# Patient Record
Sex: Female | Born: 1955 | Race: White | Hispanic: No | Smoking: Former smoker
Health system: Southern US, Community
[De-identification: ages and names within clinical notes are randomized; demographics above are authoritative.]

## PROBLEM LIST (undated history)

## (undated) DIAGNOSIS — T8859XA Other complications of anesthesia, initial encounter: Secondary | ICD-10-CM

## (undated) DIAGNOSIS — M1990 Unspecified osteoarthritis, unspecified site: Secondary | ICD-10-CM

## (undated) DIAGNOSIS — T4145XA Adverse effect of unspecified anesthetic, initial encounter: Secondary | ICD-10-CM

## (undated) DIAGNOSIS — K219 Gastro-esophageal reflux disease without esophagitis: Secondary | ICD-10-CM

## (undated) HISTORY — PX: DIAGNOSTIC LAPAROSCOPY: SUR761

## (undated) HISTORY — PX: VEIN LIGATION AND STRIPPING: SHX2653

## (undated) HISTORY — PX: LAPAROTOMY: SHX154

---

## 1979-01-04 HISTORY — PX: OVARIAN CYST SURGERY: SHX726

## 1996-05-05 HISTORY — PX: CHOLECYSTECTOMY: SHX55

## 2011-10-16 ENCOUNTER — Other Ambulatory Visit: Payer: Self-pay | Admitting: Neurosurgery

## 2011-10-20 ENCOUNTER — Inpatient Hospital Stay (HOSPITAL_COMMUNITY): Admission: RE | Admit: 2011-10-20 | Payer: BC Managed Care – PPO | Source: Ambulatory Visit

## 2011-10-21 ENCOUNTER — Other Ambulatory Visit: Payer: Self-pay | Admitting: Neurosurgery

## 2011-10-21 ENCOUNTER — Ambulatory Visit (HOSPITAL_COMMUNITY): Admission: RE | Admit: 2011-10-21 | Payer: BC Managed Care – PPO | Source: Ambulatory Visit | Admitting: Neurosurgery

## 2011-10-27 ENCOUNTER — Encounter (HOSPITAL_COMMUNITY): Payer: Self-pay | Admitting: Pharmacy Technician

## 2011-10-27 ENCOUNTER — Encounter (HOSPITAL_COMMUNITY)
Admission: RE | Admit: 2011-10-27 | Discharge: 2011-10-27 | Disposition: A | Discharge status: Home / Self Care | Payer: BC Managed Care – PPO | Source: Ambulatory Visit | Attending: Neurosurgery | Admitting: Neurosurgery

## 2011-10-27 ENCOUNTER — Encounter (HOSPITAL_COMMUNITY): Payer: Self-pay

## 2011-10-27 HISTORY — DX: Adverse effect of unspecified anesthetic, initial encounter: T41.45XA

## 2011-10-27 HISTORY — DX: Gastro-esophageal reflux disease without esophagitis: K21.9

## 2011-10-27 HISTORY — PX: POSTERIOR FUSION LUMBAR SPINE: SUR632

## 2011-10-27 HISTORY — DX: Unspecified osteoarthritis, unspecified site: M19.90

## 2011-10-27 HISTORY — DX: Other complications of anesthesia, initial encounter: T88.59XA

## 2011-10-27 LAB — BASIC METABOLIC PANEL
GFR calc Af Amer: >90 mL/min (ref >90)
GFR calc non Af Amer: >90 mL/min (ref >90)
Glucose, Bld: 92 mg/dL (ref 70–99)
Potassium: 4.2 mEq/L (ref 3.5–5.1)
Sodium: 139 mEq/L (ref 135–145)

## 2011-10-27 LAB — CBC
Hemoglobin: 13.6 g/dL (ref 12.0–15.0)
RBC: 4.13 MIL/uL (ref 3.87–5.11)

## 2011-10-27 LAB — TYPE AND SCREEN
ABO/RH(D): O POS
Antibody Screen: NEGATIVE

## 2011-10-27 LAB — SURGICAL PCR SCREEN
MRSA, PCR: NEGATIVE
Staphylococcus aureus: NEGATIVE

## 2011-10-27 NOTE — Pre-Procedure Instructions (Signed)
20 Halima V Peretz  10/27/2011   Your procedure is scheduled on:  6.25 .13  Report to [facility] at 530* AM.  Call this number if you have problems the morning of surgery: [phone]   Remember:   Do not eat food or drink:After Midnight.  .  Take these medicines the morning of surgery with A SIP OF WATER: none   Do not wear jewelry, make-up or nail polish.  Do not wear lotions, powders, or perfumes. You may wear deodorant.  Do not shave 48 hours prior to surgery. Men may shave face and neck.  Do not bring valuables to the hospital.  Contacts, dentures or bridgework may not be worn into surgery.  Leave suitcase in the car. After surgery it may be brought to your room.  For patients admitted to the hospital, checkout time is 11:00 AM the day of discharge.   Patients discharged the day of surgery will not be allowed to drive home.  Name and phone number of your driver: chris  spicer [phone]  Special Instructions: CHG Shower Use Special Wash: 1/2 bottle night before surgery and 1/2 bottle morning of surgery.   Please read over the following fact sheets that you were given: Pain Booklet, Coughing and Deep Breathing, Blood Transfusion Information, MRSA Information and Surgical Site Infection Prevention

## 2011-10-28 ENCOUNTER — Encounter (HOSPITAL_COMMUNITY): Payer: Self-pay | Admitting: Anesthesiology

## 2011-10-28 ENCOUNTER — Encounter (HOSPITAL_COMMUNITY): Admission: RE | Payer: Self-pay | Source: Ambulatory Visit

## 2011-10-28 ENCOUNTER — Encounter (HOSPITAL_COMMUNITY)
Admission: RE | Disposition: A | Discharge status: Home / Self Care | Payer: Self-pay | Source: Ambulatory Visit | Attending: Neurosurgery

## 2011-10-28 ENCOUNTER — Ambulatory Visit (HOSPITAL_COMMUNITY): Payer: BC Managed Care – PPO | Admitting: Anesthesiology

## 2011-10-28 ENCOUNTER — Inpatient Hospital Stay (HOSPITAL_COMMUNITY)
Admission: RE | Admit: 2011-10-28 | Discharge: 2011-10-30 | DRG: 756 | Disposition: A | Discharge status: Home / Self Care | Payer: BC Managed Care – PPO | Source: Ambulatory Visit | Attending: Neurosurgery | Admitting: Neurosurgery

## 2011-10-28 ENCOUNTER — Encounter (HOSPITAL_COMMUNITY): Payer: Self-pay

## 2011-10-28 ENCOUNTER — Ambulatory Visit (HOSPITAL_COMMUNITY): Payer: BC Managed Care – PPO

## 2011-10-28 DIAGNOSIS — Z01812 Encounter for preprocedural laboratory examination: Secondary | ICD-10-CM

## 2011-10-28 DIAGNOSIS — Z87891 Personal history of nicotine dependence: Secondary | ICD-10-CM

## 2011-10-28 DIAGNOSIS — M4310 Spondylolisthesis, site unspecified: Secondary | ICD-10-CM | POA: Diagnosis present

## 2011-10-28 DIAGNOSIS — Z888 Allergy status to other drugs, medicaments and biological substances status: Secondary | ICD-10-CM

## 2011-10-28 DIAGNOSIS — Z9089 Acquired absence of other organs: Secondary | ICD-10-CM

## 2011-10-28 DIAGNOSIS — M129 Arthropathy, unspecified: Secondary | ICD-10-CM | POA: Diagnosis present

## 2011-10-28 DIAGNOSIS — K219 Gastro-esophageal reflux disease without esophagitis: Secondary | ICD-10-CM | POA: Diagnosis present

## 2011-10-28 DIAGNOSIS — M48062 Spinal stenosis, lumbar region with neurogenic claudication: Principal | ICD-10-CM | POA: Diagnosis present

## 2011-10-28 DIAGNOSIS — Z881 Allergy status to other antibiotic agents status: Secondary | ICD-10-CM

## 2011-10-28 SURGERY — POSTERIOR LUMBAR FUSION 1 LEVEL
Anesthesia: General | Site: Back | Wound class: Clean

## 2011-10-28 SURGERY — POSTERIOR LUMBAR FUSION 1 LEVEL
Anesthesia: General | Site: Back

## 2011-10-28 MED ORDER — GABAPENTIN 100 MG PO CAPS
200.0000 mg | ORAL_CAPSULE | Freq: Three times a day (TID) | ORAL | Status: DC
  Administered 2011-10-28 – 2011-10-30 (×6): 200 mg via ORAL
  Filled 2011-10-28 (×8): qty 2

## 2011-10-28 MED ORDER — SODIUM CHLORIDE 0.9 % IV SOLN
INTRAVENOUS | Status: DC
  Administered 2011-10-29 (×2): via INTRAVENOUS

## 2011-10-28 MED ORDER — SODIUM CHLORIDE 0.9 % IJ SOLN
3.0000 mL | Freq: Two times a day (BID) | INTRAMUSCULAR | Status: DC
  Administered 2011-10-28 – 2011-10-30 (×5): 3 mL via INTRAVENOUS

## 2011-10-28 MED ORDER — MIDAZOLAM HCL 5 MG/5ML IJ SOLN
INTRAMUSCULAR | Status: DC | PRN
  Administered 2011-10-28: 2 mg via INTRAVENOUS

## 2011-10-28 MED ORDER — HEMOSTATIC AGENTS (NO CHARGE) OPTIME
TOPICAL | Status: DC | PRN
  Administered 2011-10-28: 1 via TOPICAL

## 2011-10-28 MED ORDER — ROCURONIUM BROMIDE 100 MG/10ML IV SOLN
INTRAVENOUS | Status: DC | PRN
  Administered 2011-10-28: 50 mg via INTRAVENOUS

## 2011-10-28 MED ORDER — BUPIVACAINE-EPINEPHRINE PF 0.5-1:200000 % IJ SOLN
INTRAMUSCULAR | Status: DC | PRN
  Administered 2011-10-28: 20 mL

## 2011-10-28 MED ORDER — LACTATED RINGERS IV SOLN
INTRAVENOUS | Status: DC | PRN
  Administered 2011-10-28 (×2): via INTRAVENOUS

## 2011-10-28 MED ORDER — CEFAZOLIN SODIUM 1-5 GM-% IV SOLN
1.0000 g | INTRAVENOUS | Status: AC
Start: 2011-10-28 — End: 2011-10-28
  Administered 2011-10-28: 1 g via INTRAVENOUS

## 2011-10-28 MED ORDER — PROPOFOL 10 MG/ML IV EMUL
INTRAVENOUS | Status: DC | PRN
  Administered 2011-10-28: 180 mg via INTRAVENOUS

## 2011-10-28 MED ORDER — ACETAMINOPHEN 650 MG RE SUPP: 650.0000 mg | RECTAL | Status: DC | PRN

## 2011-10-28 MED ORDER — 0.9 % SODIUM CHLORIDE (POUR BTL) OPTIME
TOPICAL | Status: DC | PRN
  Administered 2011-10-28: 1000 mL

## 2011-10-28 MED ORDER — MORPHINE SULFATE 4 MG/ML IJ SOLN: 4.0000 mg | INTRAMUSCULAR | Status: DC | PRN

## 2011-10-28 MED ORDER — DROPERIDOL 2.5 MG/ML IJ SOLN: 0.6250 mg | INTRAMUSCULAR | Status: DC | PRN

## 2011-10-28 MED ORDER — CEFAZOLIN SODIUM 1-5 GM-% IV SOLN
INTRAVENOUS | Status: AC
  Administered 2011-10-28: 06:00:00
  Filled 2011-10-28: qty 50

## 2011-10-28 MED ORDER — DIAZEPAM 5 MG PO TABS
5.0000 mg | ORAL_TABLET | Freq: Four times a day (QID) | ORAL | Status: DC | PRN
  Administered 2011-10-29 – 2011-10-30 (×2): 5 mg via ORAL
  Filled 2011-10-28 (×2): qty 1

## 2011-10-28 MED ORDER — VECURONIUM BROMIDE 10 MG IV SOLR
INTRAVENOUS | Status: DC | PRN
  Administered 2011-10-28: 2 mg via INTRAVENOUS
  Administered 2011-10-28 (×2): 1 mg via INTRAVENOUS

## 2011-10-28 MED ORDER — ACETAMINOPHEN 325 MG PO TABS: 650.0000 mg | ORAL_TABLET | ORAL | Status: DC | PRN

## 2011-10-28 MED ORDER — SODIUM CHLORIDE 0.9 % IV SOLN: 250.0000 mL | INTRAVENOUS | Status: DC

## 2011-10-28 MED ORDER — MENTHOL 3 MG MT LOZG: 1.0000 | LOZENGE | OROMUCOSAL | Status: DC | PRN

## 2011-10-28 MED ORDER — SODIUM CHLORIDE 0.9 % IJ SOLN: 3.0000 mL | INTRAMUSCULAR | Status: DC | PRN

## 2011-10-28 MED ORDER — PHENYLEPHRINE HCL 10 MG/ML IJ SOLN
INTRAMUSCULAR | Status: DC | PRN
  Administered 2011-10-28: 80 ug via INTRAVENOUS

## 2011-10-28 MED ORDER — PHENOL 1.4 % MT LIQD: 1.0000 | OROMUCOSAL | Status: DC | PRN

## 2011-10-28 MED ORDER — GLYCOPYRROLATE 0.2 MG/ML IJ SOLN
INTRAMUSCULAR | Status: DC | PRN
  Administered 2011-10-28: 0.4 mg via INTRAVENOUS

## 2011-10-28 MED ORDER — FENTANYL CITRATE 0.05 MG/ML IJ SOLN
INTRAMUSCULAR | Status: DC | PRN
  Administered 2011-10-28 (×2): 100 ug via INTRAVENOUS
  Administered 2011-10-28: 50 ug via INTRAVENOUS

## 2011-10-28 MED ORDER — OXYCODONE-ACETAMINOPHEN 5-325 MG PO TABS
1.0000 | ORAL_TABLET | ORAL | Status: DC | PRN
  Administered 2011-10-28 (×2): 2 via ORAL
  Administered 2011-10-28 – 2011-10-29 (×2): 1 via ORAL
  Administered 2011-10-29 – 2011-10-30 (×7): 2 via ORAL
  Filled 2011-10-28: qty 2
  Filled 2011-10-28: qty 1
  Filled 2011-10-28 (×9): qty 2

## 2011-10-28 MED ORDER — CEFAZOLIN SODIUM 1-5 GM-% IV SOLN
1.0000 g | Freq: Three times a day (TID) | INTRAVENOUS | Status: AC
  Administered 2011-10-28 – 2011-10-29 (×2): 1 g via INTRAVENOUS
  Filled 2011-10-28 (×2): qty 50

## 2011-10-28 MED ORDER — ONDANSETRON HCL 4 MG/2ML IJ SOLN
INTRAMUSCULAR | Status: DC | PRN
  Administered 2011-10-28: 4 mg via INTRAVENOUS

## 2011-10-28 MED ORDER — THROMBIN 20000 UNITS EX KIT
PACK | CUTANEOUS | Status: DC | PRN
  Administered 2011-10-28: 20000 [IU] via TOPICAL

## 2011-10-28 MED ORDER — LIDOCAINE HCL (CARDIAC) 20 MG/ML IV SOLN
INTRAVENOUS | Status: DC | PRN
  Administered 2011-10-28: 100 mg via INTRAVENOUS

## 2011-10-28 MED ORDER — HYDROMORPHONE HCL PF 1 MG/ML IJ SOLN
INTRAMUSCULAR | Status: AC
  Filled 2011-10-28: qty 1

## 2011-10-28 MED ORDER — NEOSTIGMINE METHYLSULFATE 1 MG/ML IJ SOLN
INTRAMUSCULAR | Status: DC | PRN
  Administered 2011-10-28: 3 mg via INTRAVENOUS

## 2011-10-28 MED ORDER — PNEUMOCOCCAL VAC POLYVALENT 25 MCG/0.5ML IJ INJ
0.5000 mL | INJECTION | INTRAMUSCULAR | Status: AC
  Administered 2011-10-29: 0.5 mL via INTRAMUSCULAR
  Filled 2011-10-28: qty 0.5

## 2011-10-28 MED ORDER — BISACODYL 5 MG PO TBEC: 5.0000 mg | DELAYED_RELEASE_TABLET | Freq: Every day | ORAL | Status: DC | PRN

## 2011-10-28 MED ORDER — ONDANSETRON HCL 4 MG/2ML IJ SOLN: 4.0000 mg | INTRAMUSCULAR | Status: DC | PRN

## 2011-10-28 MED ORDER — HYDROMORPHONE HCL PF 1 MG/ML IJ SOLN
0.2500 mg | INTRAMUSCULAR | Status: DC | PRN
  Administered 2011-10-28: 0.25 mg via INTRAVENOUS
  Administered 2011-10-28: 0.5 mg via INTRAVENOUS

## 2011-10-28 MED ORDER — ZOLPIDEM TARTRATE 10 MG PO TABS: 10.0000 mg | ORAL_TABLET | Freq: Every evening | ORAL | Status: DC | PRN

## 2011-10-28 MED ORDER — EPHEDRINE SULFATE 50 MG/ML IJ SOLN
INTRAMUSCULAR | Status: DC | PRN
  Administered 2011-10-28: 10 mg via INTRAVENOUS
  Administered 2011-10-28: 5 mg via INTRAVENOUS
  Administered 2011-10-28: 15 mg via INTRAVENOUS
  Administered 2011-10-28 (×2): 10 mg via INTRAVENOUS

## 2011-10-28 SURGICAL SUPPLY — 65 items
BENZOIN TINCTURE PRP APPL 2/3 (GAUZE/BANDAGES/DRESSINGS) ×2 IMPLANT
BLADE SURG ROTATE 9660 (MISCELLANEOUS) IMPLANT
BUR ACORN 6.0 (BURR) ×2 IMPLANT
BUR MATCHSTICK NEURO 3.0 LAGG (BURR) ×2 IMPLANT
CANISTER SUCTION 2500CC (MISCELLANEOUS) ×2 IMPLANT
CAP REVERE LOCKING (Cap) ×8 IMPLANT
CLOTH BEACON ORANGE TIMEOUT ST (SAFETY) ×2 IMPLANT
CONT SPEC 4OZ CLIKSEAL STRL BL (MISCELLANEOUS) ×4 IMPLANT
COVER BACK TABLE 24X17X13 BIG (DRAPES) IMPLANT
COVER TABLE BACK 60X90 (DRAPES) ×2 IMPLANT
DRAPE C-ARM 42X72 X-RAY (DRAPES) ×4 IMPLANT
DRAPE LAPAROTOMY 100X72X124 (DRAPES) ×2 IMPLANT
DRAPE POUCH INSTRU U-SHP 10X18 (DRAPES) ×2 IMPLANT
DRSG PAD ABDOMINAL 8X10 ST (GAUZE/BANDAGES/DRESSINGS) ×4 IMPLANT
DURAPREP 26ML APPLICATOR (WOUND CARE) ×2 IMPLANT
ELECT BLADE 4.0 EZ CLEAN MEGAD (MISCELLANEOUS) ×2
ELECT REM PT RETURN 9FT ADLT (ELECTROSURGICAL) ×2
ELECTRODE BLDE 4.0 EZ CLN MEGD (MISCELLANEOUS) ×1 IMPLANT
ELECTRODE REM PT RTRN 9FT ADLT (ELECTROSURGICAL) ×1 IMPLANT
EVACUATOR 1/8 PVC DRAIN (DRAIN) IMPLANT
GAUZE SPONGE 4X4 16PLY XRAY LF (GAUZE/BANDAGES/DRESSINGS) ×2 IMPLANT
GLOVE BIOGEL M 8.0 STRL (GLOVE) ×4 IMPLANT
GLOVE BIOGEL PI IND STRL 8.5 (GLOVE) ×2 IMPLANT
GLOVE BIOGEL PI INDICATOR 8.5 (GLOVE) ×2
GLOVE EXAM NITRILE LRG STRL (GLOVE) IMPLANT
GLOVE EXAM NITRILE MD LF STRL (GLOVE) ×2 IMPLANT
GLOVE EXAM NITRILE XL STR (GLOVE) IMPLANT
GLOVE EXAM NITRILE XS STR PU (GLOVE) IMPLANT
GLOVE SURG SS PI 8.0 STRL IVOR (GLOVE) ×6 IMPLANT
GOWN BRE IMP SLV AUR LG STRL (GOWN DISPOSABLE) IMPLANT
GOWN BRE IMP SLV AUR XL STRL (GOWN DISPOSABLE) ×6 IMPLANT
GOWN STRL REIN 2XL LVL4 (GOWN DISPOSABLE) ×2 IMPLANT
KIT BASIN OR (CUSTOM PROCEDURE TRAY) ×2 IMPLANT
KIT ROOM TURNOVER OR (KITS) ×2 IMPLANT
MILL MEDIUM DISP (BLADE) ×2 IMPLANT
NEEDLE HYPO 18GX1.5 BLUNT FILL (NEEDLE) IMPLANT
NEEDLE HYPO 21X1.5 SAFETY (NEEDLE) IMPLANT
NEEDLE HYPO 25X1 1.5 SAFETY (NEEDLE) ×2 IMPLANT
NEEDLE SPNL 22GX3.5 QUINCKE BK (NEEDLE) ×2 IMPLANT
NS IRRIG 1000ML POUR BTL (IV SOLUTION) ×2 IMPLANT
PACK LAMINECTOMY NEURO (CUSTOM PROCEDURE TRAY) ×2 IMPLANT
PAD ARMBOARD 7.5X6 YLW CONV (MISCELLANEOUS) ×6 IMPLANT
PATTIES SURGICAL .5 X1 (DISPOSABLE) ×2 IMPLANT
PATTIES SURGICAL .5 X3 (DISPOSABLE) IMPLANT
ROD REVERE 6.35 40MM (Rod) ×4 IMPLANT
SCREW REVERE 6.35 5.5X40MM (Screw) ×8 IMPLANT
SPACER SUSTAIN O SML 8X22 13MM (Peek) ×4 IMPLANT
SPONGE GAUZE 4X4 12PLY (GAUZE/BANDAGES/DRESSINGS) ×2 IMPLANT
SPONGE LAP 4X18 X RAY DECT (DISPOSABLE) IMPLANT
SPONGE NEURO XRAY DETECT 1X3 (DISPOSABLE) IMPLANT
SPONGE SURGIFOAM ABS GEL 100 (HEMOSTASIS) ×2 IMPLANT
STRIP CLOSURE SKIN 1/2X4 (GAUZE/BANDAGES/DRESSINGS) ×2 IMPLANT
SUT PROLENE 5 0 C1 (SUTURE) ×2 IMPLANT
SUT VIC AB 1 CT1 18XBRD ANBCTR (SUTURE) ×1 IMPLANT
SUT VIC AB 1 CT1 8-18 (SUTURE) ×1
SUT VIC AB 2-0 CP2 18 (SUTURE) ×2 IMPLANT
SUT VIC AB 3-0 SH 8-18 (SUTURE) ×2 IMPLANT
SYR 20CC LL (SYRINGE) IMPLANT
SYR 20ML ECCENTRIC (SYRINGE) ×2 IMPLANT
SYR 5ML LL (SYRINGE) IMPLANT
TAPE CLOTH SURG 4X10 WHT LF (GAUZE/BANDAGES/DRESSINGS) ×2 IMPLANT
TOWEL OR 17X24 6PK STRL BLUE (TOWEL DISPOSABLE) ×2 IMPLANT
TOWEL OR 17X26 10 PK STRL BLUE (TOWEL DISPOSABLE) ×2 IMPLANT
TRAY FOLEY CATH 14FRSI W/METER (CATHETERS) ×2 IMPLANT
WATER STERILE IRR 1000ML POUR (IV SOLUTION) ×2 IMPLANT

## 2011-10-28 NOTE — Anesthesia Preprocedure Evaluation (Signed)
Anesthesia Evaluation  Patient identified by MRN, date of birth, ID band Patient awake    Reviewed: Allergy & Precautions, H&P , NPO status , Patient's Chart, lab work & pertinent test results  History of Anesthesia Complications Negative for: history of anesthetic complications  Airway Mallampati: I TM Distance: >3 FB Neck ROM: Full    Dental  (+) Teeth Intact and Dental Advisory Given   Pulmonary neg pulmonary ROS, former smoker breath sounds clear to auscultation  Pulmonary exam normal       Cardiovascular negative cardio ROS  Rhythm:Regular Rate:Normal     Neuro/Psych    GI/Hepatic Neg liver ROS, GERD-  Controlled,  Endo/Other  negative endocrine ROS  Renal/GU negative Renal ROS     Musculoskeletal   Abdominal   Peds  Hematology   Anesthesia Other Findings   Reproductive/Obstetrics                           Anesthesia Physical Anesthesia Plan  ASA: II  Anesthesia Plan: General   Post-op Pain Management:    Induction: Intravenous  Airway Management Planned: Oral ETT  Additional Equipment:   Intra-op Plan:   Post-operative Plan: Extubation in OR  Informed Consent: I have reviewed the patients History and Physical, chart, labs and discussed the procedure including the risks, benefits and alternatives for the proposed anesthesia with the patient or authorized representative who has indicated his/her understanding and acceptance.   Dental advisory given  Plan Discussed with: CRNA, Anesthesiologist and Surgeon  Anesthesia Plan Comments:         Anesthesia Quick Evaluation

## 2011-10-28 NOTE — Progress Notes (Signed)
Fusion at l45 done.op note [phone]

## 2011-10-28 NOTE — Transfer of Care (Signed)
Immediate Anesthesia Transfer of Care Note  Patient: Sherry Willis  Procedure(s) Performed: Procedure(s) (LRB): POSTERIOR LUMBAR FUSION 1 LEVEL (N/A)  Patient Location: PACU  Anesthesia Type: General  Level of Consciousness: awake, alert  and oriented  Airway & Oxygen Therapy: Patient Spontanous Breathing and Patient connected to nasal cannula oxygen  Post-op Assessment: Report given to PACU RN  Post vital signs: Reviewed and stable  Complications: No apparent anesthesia complications

## 2011-10-28 NOTE — Care Management Note (Signed)
    Page 1 of 2   10/30/2011     2:08:07 PM   CARE MANAGEMENT NOTE 10/30/2011  Patient:  Sherry Willis, Sherry Willis   Account Number:  [id number]  Date Initiated:  10/28/2011  Documentation initiated by:  Onnie Boer  Subjective/Objective Assessment:   PT WAS ADMITTED FOR SURGERY     Action/Plan:   PROGRESSION OF CARE AND DISCHARGE PLANNING  PT eval-recommending HHPT  OT eval-nno d/c needsl   Anticipated DC Date:  10/30/2011   Anticipated DC Plan:  HOME W HOME HEALTH SERVICES      DC Planning Services  CM consult      Choice offered to / List presented to:  C-1 Patient        HH arranged  HH-2 PT      HH agency  [facility]   Status of service:  Completed, signed off Medicare Important Message given?   (If response is "NO", the following Medicare IM given date fields will be blank) Date Medicare IM given:   Date Additional Medicare IM given:    Discharge Disposition:  HOME W HOME HEALTH SERVICES  Per UR Regulation:  Reviewed for med. necessity/level of care/duration of stay  If discussed at Long Length of Stay Meetings, dates discussed:    Comments:  10/30/11 Spoke with patient about HHC for HHPT. She chose [facility] from the [facility] list of [facility] agencies. Patient will be staying with her boyfriend  Sherry Willis who lives in [place]. Contacted Breck [place] at [facility] and requested HHPT and gave address and phone # of Mr Sherry Willis.No equipment needs identified by PT, OT or patient.Jacquelynn Cree RN, BSN, CCM  10/28/11 Onnie Boer, RN, BSN 1422 PT WAS ADMITTED FOR A FUSION.  PTA PT WAS AT HOME WITH SELF / FAMILY CARE.  WILL F/U WITH DC NEEDS AND PT/OT EVAL.

## 2011-10-28 NOTE — Clinical Social Work Note (Signed)
CSW received consult for SNF. Awaiting PT/OT evals for discharge recommendations. Please call with any urgent needs.   Dede Query, MSW, Theresia Majors [phone]

## 2011-10-28 NOTE — H&P (Signed)
Sherry Willis is an 56 y.o. female.   Chief Complaint: lumbar pain with radiation to both ,legs associated with sens gi and gu are normalory changes.  Past Medical History  Diagnosis Date  . GERD (gastroesophageal reflux disease)     occ-  prilosec  . Arthritis   . Complication of anesthesia     occ blood pressures drops    Past Surgical History  Procedure Date  . Vein ligation and stripping 80's  . Cholecystectomy 98  . Ovarian cyst surgery 80's  . Laparotomy 70's 80's    History reviewed. No pertinent family history. Social History:  reports that she quit smoking about 2 months ago. Her smoking use included Cigarettes. She has a 30 pack-year smoking history. She does not have any smokeless tobacco history on file. She reports that she drinks about 8.4 ounces of alcohol per week. She reports that she does not use illicit drugs.  Allergies:  Allergies  Allergen Reactions  . Flagyl (Metronidazole) Hives  . Naproxen Rash    Medications Prior to Admission  Medication Sig Dispense Refill  . CALCIUM PO Take 1 tablet by mouth daily.      . Cholecalciferol (VITAMIN D PO) Take 1 capsule by mouth daily.      Marland Kitchen gabapentin (NEURONTIN) 100 MG capsule Take 200 mg by mouth 3 (three) times daily.      Marland Kitchen HYDROcodone-acetaminophen (VICODIN) 5-500 MG per tablet Take 1 tablet by mouth every 12 (twelve) hours as needed. For pain      . Multiple Vitamin (MULTIVITAMIN WITH MINERALS) TABS Take 1 tablet by mouth daily.      . Multiple Vitamins-Minerals (ZINC PO) Take 1 tablet by mouth daily. Over the counter supplement      . omega-3 acid ethyl esters (LOVAZA) 1 G capsule Take 2 g by mouth daily.      Marland Kitchen POTASSIUM PO Take 1 tablet by mouth daily. Over the counter supplement      . vitamin A 8000 UNIT capsule Take 8,000 Units by mouth daily.        Results for orders placed during the hospital encounter of 10/27/11 (from the past 48 hour(s))  SURGICAL PCR SCREEN     Status: Normal   Collection  Time   10/27/11  3:37 PM      Component Value Range Comment   MRSA, PCR NEGATIVE  NEGATIVE    Staphylococcus aureus NEGATIVE  NEGATIVE   BASIC METABOLIC PANEL     Status: Normal   Collection Time   10/27/11  3:42 PM      Component Value Range Comment   Sodium 139  135 - 145 mEq/L    Potassium 4.2  3.5 - 5.1 mEq/L    Chloride 100  96 - 112 mEq/L    CO2 27  19 - 32 mEq/L    Glucose, Bld 92  70 - 99 mg/dL    BUN 20  6 - 23 mg/dL    Creatinine, Ser 4.09  0.50 - 1.10 mg/dL    Calcium 81.1  8.4 - 10.5 mg/dL    GFR calc non Af Amer >90  >90 mL/min    GFR calc Af Amer >90  >90 mL/min   CBC     Status: Normal   Collection Time   10/27/11  3:42 PM      Component Value Range Comment   WBC 6.7  4.0 - 10.5 K/uL    RBC 4.13  3.87 - 5.11 MIL/uL  Hemoglobin 13.6  12.0 - 15.0 g/dL    HCT 95.6  21.3 - 08.6 %    MCV 96.1  78.0 - 100.0 fL    MCH 32.9  26.0 - 34.0 pg    MCHC 34.3  30.0 - 36.0 g/dL    RDW 57.8  46.9 - 62.9 %    Platelets 239  150 - 400 K/uL   TYPE AND SCREEN     Status: Normal   Collection Time   10/27/11  3:47 PM      Component Value Range Comment   ABO/RH(D) O POS      Antibody Screen NEG      Sample Expiration 10/30/2011     ABO/RH     Status: Normal   Collection Time   10/27/11  3:47 PM      Component Value Range Comment   ABO/RH(D) O POS      No results found.  Review of Systems  Constitutional: Negative.   HENT: Positive for tinnitus.   Eyes: Negative.   Respiratory: Negative.   Cardiovascular: Negative.   Gastrointestinal: Negative.   Genitourinary: Negative.   Musculoskeletal: Positive for back pain.  Neurological: Positive for focal weakness.  Endo/Heme/Allergies: Negative.   Psychiatric/Behavioral: Negative.     Blood pressure 121/71, pulse 51, temperature 97.9 F (36.6 C), temperature source Oral, resp. rate 18, SpO2 100.00%. Physical Exam hent,nl. erck, nl. Cv, nl. Lungs, nl abdomen, nl, extremities nl. NEURO weakness of df both feet. slr positive at  60. Pain while walkin in tiptoes. Lumbar mri showed grade 1 spondylolisthesis at l45 with severe stenosis.  Assessment/Plan Decompression and fusion at l45 with cages and screws. patiebt aware of risks and benefits  Sherry Willis M 10/28/2011, 7:52 AM

## 2011-10-28 NOTE — Anesthesia Postprocedure Evaluation (Signed)
Anesthesia Post Note  Patient: Sherry Willis  Procedure(s) Performed: Procedure(s) (LRB): POSTERIOR LUMBAR FUSION 1 LEVEL (N/A)  Anesthesia type: general  Patient location: PACU  Post pain: Pain level controlled  Post assessment: Patient's Cardiovascular Status Stable  Last Vitals:  Filed Vitals:   10/28/11 1100  BP:   Pulse: 66  Temp:   Resp: 11    Post vital signs: Reviewed and stable  Level of consciousness: sedated  Complications: No apparent anesthesia complications

## 2011-10-28 NOTE — Preoperative (Signed)
Beta Blockers   Reason not to administer Beta Blockers:Not Applicable 

## 2011-10-28 NOTE — Anesthesia Procedure Notes (Signed)
Procedure Name: Intubation Date/Time: 10/28/2011 8:05 AM Performed by: Jefm Miles E Pre-anesthesia Checklist: Patient identified, Timeout performed, Emergency Drugs available, Suction available and Patient being monitored Patient Re-evaluated:Patient Re-evaluated prior to inductionOxygen Delivery Method: Circle system utilized Preoxygenation: Pre-oxygenation with 100% oxygen Intubation Type: IV induction Ventilation: Mask ventilation without difficulty Laryngoscope Size: Mac and 3 Grade View: Grade I Tube type: Oral Tube size: 7.0 mm Number of attempts: 1 Airway Equipment and Method: Stylet Placement Confirmation: ETT inserted through vocal cords under direct vision,  breath sounds checked- equal and bilateral and positive ETCO2 Secured at: 22 cm Tube secured with: Tape Dental Injury: Teeth and Oropharynx as per pre-operative assessment

## 2011-10-28 NOTE — Op Note (Signed)
[name], Sherry Willis                ACCOUNT NO.:  [id number]  MEDICAL RECORD NO.:  [id number]  LOCATION:  3016                         FACILITY:  [facility]  PHYSICIAN:  Hilda Lias, M.D.   DATE OF BIRTH:  May 03, 1956  DATE OF PROCEDURE:  10/28/2011 DATE OF DISCHARGE:                              OPERATIVE REPORT   PREOPERATIVE DIAGNOSES:  L4-L5 spondylolisthesis with neurogenic claudication, lumbar stenosis, and chronic radiculopathy.  POSTOPERATIVE DIAGNOSES:  L4-L5 spondylolisthesis with neurogenic claudication, lumbar stenosis, and chronic radiculopathy.  PROCEDURE:  Bilateral laminectomy of L4, bilateral facetectomy, bilateral total gross diskectomy of the L4-5 disk.  Region was normal with this procedure to be able to accommodate the cages, insertion of two cages of 12 x 22 with autograft inside, pedicle screws at L4-L5, posterolateral arthrodesis with autograft, Cell Saver, C-arm.  SURGEON:  Hilda Lias, MD  ASSISTANT:  Reinaldo Meeker, MD  CLINICAL HISTORY:  The patient is a lady who came to see me in my office because of back pain radiation to both legs.  She had failed with conservative treatment.  X-rays showed severe stenosis at the level of L4-5 with spondylolisthesis.  Surgery was advised and the risk was fully explained to her.  PROCEDURE:  The patient was taken to the OR, and after intubation, pseudoarthrosis in a prone manner.  The back was cleaned with DuraPrep. Drapes were applied.  By plain x-ray, we localized the area between L4-5 and midline incision from the upper of L4 down to L5 was made.  Muscles were retracted laterally all the way down until we were able to see the lateral aspect of the facet of L4-5.  Then, we proceeded with removal of spinous process of L4 as well as the lamina and from then on, we removed the loose facet of L4.  The patient had quite a bit of thickening of the yellow ligament and also was decompressed.  Then, we entered the  disk space in the right side first and then on the left side and total gross diskectomy.  Groin region was normal.  We were able to select the disk space with cages.  At the end, we had plenty of room and we were able to introduce two cages PEEK of 14 x 22 with autograft inside.  The rest of the space was fully packed with additional autograft.  Then, using the C- arm equipment in an AP view and then in a lateral view, we probed the pedicles of L4 and L5.  Prior to insertion, we were 100% sure that the hole was surrounded by bones.  Then, four screws of 5.5 x 40 were inserted.  On visualization of the x-ray machine, C-arm, and the screws were connected with the rod and Capps.  From then on, we removed the periosteum on the lateral aspect of the facet at L4-5 bilaterally and the rest of the autograft was used for arthrodesis. Valsalva maneuver was negative at 2:40.  From then on, the area was irrigated and closed with Vicryl and Steri-Strip.          ______________________________ Hilda Lias, M.D.     EB/MEDQ  D:  10/28/2011  T:  10/28/2011  Job:  [id number]

## 2011-10-28 NOTE — H&P (Signed)
Sherry Willis is an 56 y.o. female.   Chief Complaint:HPI: lbp History of lbp with radiation to both lower extremities associated withsdensory changes Past Medical History  Diagnosis Date  . GERD (gastroesophageal reflux disease)     occ-  prilosec  . Arthritis   . Complication of anesthesia     occ blood pressures drops    Past Surgical History  Procedure Date  . Vein ligation and stripping 80's  . Cholecystectomy 98  . Ovarian cyst surgery 80's  . Laparotomy 70's 80's    History reviewed. No pertinent family history. Social History:  reports that she quit smoking about 2 months ago. Her smoking use included Cigarettes. She has a 30 pack-year smoking history. She does not have any smokeless tobacco history on file. She reports that she drinks about 8.4 ounces of alcohol per week. She reports that she does not use illicit drugs.  Allergies:  Allergies  Allergen Reactions  . Flagyl (Metronidazole) Hives  . Naproxen Rash    Medications Prior to Admission  Medication Sig Dispense Refill  . CALCIUM PO Take 1 tablet by mouth daily.      . Cholecalciferol (VITAMIN D PO) Take 1 capsule by mouth daily.      Marland Kitchen gabapentin (NEURONTIN) 100 MG capsule Take 200 mg by mouth 3 (three) times daily.      Marland Kitchen HYDROcodone-acetaminophen (VICODIN) 5-500 MG per tablet Take 1 tablet by mouth every 12 (twelve) hours as needed. For pain      . Multiple Vitamin (MULTIVITAMIN WITH MINERALS) TABS Take 1 tablet by mouth daily.      . Multiple Vitamins-Minerals (ZINC PO) Take 1 tablet by mouth daily. Over the counter supplement      . omega-3 acid ethyl esters (LOVAZA) 1 G capsule Take 2 g by mouth daily.      Marland Kitchen POTASSIUM PO Take 1 tablet by mouth daily. Over the counter supplement      . vitamin A 8000 UNIT capsule Take 8,000 Units by mouth daily.        Results for orders placed during the hospital encounter of 10/27/11 (from the past 48 hour(s))  SURGICAL PCR SCREEN     Status: Normal   Collection  Time   10/27/11  3:37 PM      Component Value Range Comment   MRSA, PCR NEGATIVE  NEGATIVE    Staphylococcus aureus NEGATIVE  NEGATIVE   BASIC METABOLIC PANEL     Status: Normal   Collection Time   10/27/11  3:42 PM      Component Value Range Comment   Sodium 139  135 - 145 mEq/L    Potassium 4.2  3.5 - 5.1 mEq/L    Chloride 100  96 - 112 mEq/L    CO2 27  19 - 32 mEq/L    Glucose, Bld 92  70 - 99 mg/dL    BUN 20  6 - 23 mg/dL    Creatinine, Ser 5.62  0.50 - 1.10 mg/dL    Calcium 13.0  8.4 - 10.5 mg/dL    GFR calc non Af Amer >90  >90 mL/min    GFR calc Af Amer >90  >90 mL/min   CBC     Status: Normal   Collection Time   10/27/11  3:42 PM      Component Value Range Comment   WBC 6.7  4.0 - 10.5 K/uL    RBC 4.13  3.87 - 5.11 MIL/uL    Hemoglobin  13.6  12.0 - 15.0 g/dL    HCT 16.1  09.6 - 04.5 %    MCV 96.1  78.0 - 100.0 fL    MCH 32.9  26.0 - 34.0 pg    MCHC 34.3  30.0 - 36.0 g/dL    RDW 40.9  81.1 - 91.4 %    Platelets 239  150 - 400 K/uL   TYPE AND SCREEN     Status: Normal   Collection Time   10/27/11  3:47 PM      Component Value Range Comment   ABO/RH(D) O POS      Antibody Screen NEG      Sample Expiration 10/30/2011     ABO/RH     Status: Normal   Collection Time   10/27/11  3:47 PM      Component Value Range Comment   ABO/RH(D) O POS      No results found.  Review of Systems  Constitutional: Negative.   HENT: Positive for tinnitus.   Eyes: Negative.   Respiratory: Negative.   Cardiovascular: Negative.   Gastrointestinal: Negative.   Musculoskeletal: Negative.   Skin: Negative.   Neurological: Positive for focal weakness.  Endo/Heme/Allergies: Negative.   Psychiatric/Behavioral: Negative.     Blood pressure 121/71, pulse 51, temperature 97.9 F (36.6 C), temperature source Oral, resp. rate 18, SpO2 100.00%. Physical Exam hent nl. Neck, nl, lungs, nl. Cv, nl abdomen, nl. Extremities, nl; NEURO weakness of df both feet. slr positive at  60  Assessment/Plan Decompression and fusion at l45 with cages and screws  Jonothan Heberle M 10/28/2011, 8:00 AM

## 2011-10-29 NOTE — Clinical Social Work Psychosocial (Signed)
     Clinical Social Work Department BRIEF PSYCHOSOCIAL ASSESSMENT 10/29/2011  Patient:  Sherry Willis, Sherry Willis     Account Number:  [id number]     Admit date:  10/28/2011  Clinical Social Worker:  Peggyann Shoals  Date/Time:  10/29/2011 04:31 PM  Referred by:  Physician  Date Referred:  10/29/2011 Referred for  SNF Placement   Other Referral:   Interview type:  Family Other interview type:    PSYCHOSOCIAL DATA Living Status:  FAMILY Admitted from facility:   Level of care:   Primary support name:  [name] Primary support relationship to patient:  FRIEND Degree of support available:   Significant other, very supportive.    CURRENT CONCERNS Current Concerns  None Noted   Other Concerns:    SOCIAL WORK ASSESSMENT / PLAN PT is recommending home health PT. RNCM is aware and following. CSW met with pt to provide advanced directive packet and MOST form. Pt shared that she would like to read over the material and then follow up with CSW if she would like to have advanced directives notarized.    CSW is signing off as no further needs identified at this time. Please call if pt would like to have her Advanced Directives notarized.   Assessment/plan status:  No Further Intervention Required Other assessment/ plan:   Information/referral to community resources:   Designer, industrial/product.    PATIENTS/FAMILYS RESPONSE TO PLAN OF CARE: Pt was very pleasant and agreeable to discharge plan. Pt will contact CSW if she would like to pursue Advanced Directives.

## 2011-10-29 NOTE — Progress Notes (Signed)
Patient ID: Sherry Willis, female   DOB: 06-01-1955, 56 y.o.   MRN: [id number] Doing well. Foley out. Ambulating, no weakness

## 2011-10-29 NOTE — Evaluation (Signed)
Occupational Therapy Evaluation Patient Details Name: Sherry Willis MRN: [id number] DOB: [birth year] Today's Date: 10/29/2011 Time: 4098-1191 OT Time Calculation (min): 32 min  OT Assessment / Plan / Recommendation Clinical Impression  This 56 yo female s/p back fusion surgery presents to acute OT with problems below. Will benefit from acute OT to get to an I/mod I level.    OT Assessment  Patient needs continued OT Services    Follow Up Recommendations  No OT follow up    Barriers to Discharge None    Equipment Recommendations  3 in 1 bedside comode (tub equipment TBD 10/30/2011)    Recommendations for Other Services    Frequency  Min 2X/week    Precautions / Restrictions Precautions Precautions: Back Required Braces or Orthoses: Spinal Brace Spinal Brace: Applied in sitting position;Lumbar corset Restrictions Weight Bearing Restrictions: No   Pertinent Vitals/Pain 4/10 back    ADL  Eating/Feeding: Simulated;Independent Where Assessed - Eating/Feeding: Chair Grooming: Simulated;Supervision/safety;Set up Where Assessed - Grooming: Unsupported standing Upper Body Bathing: Simulated;Set up;Supervision/safety Where Assessed - Upper Body Bathing: Unsupported sitting Lower Body Bathing: Performed;Set up;Supervision/safety Where Assessed - Lower Body Bathing: Unsupported sit to stand Upper Body Dressing: Simulated;Supervision/safety;Set up Where Assessed - Upper Body Dressing: Supported sitting Lower Body Dressing: Simulated;Set up;Supervision/safety Where Assessed - Lower Body Dressing: Unsupported sit to stand Toilet Transfer: Performed;Min guard Toilet Transfer Method: Sit to stand Toilet Transfer Equipment: Raised toilet seat with arms (or 3-in-1 over toilet) Toileting - Clothing Manipulation and Hygiene: Performed;Modified independent Where Assessed - Toileting Clothing Manipulation and Hygiene: Standing Equipment Used: Back brace;Rolling walker Transfers/Ambulation  Related to ADLs: Min A ADL Comments: Pt can cross legs to get to her feet for LB ADLs    OT Diagnosis: Generalized weakness;Acute pain  OT Problem List: Decreased strength;Impaired balance (sitting and/or standing);Decreased knowledge of precautions;Decreased knowledge of use of DME or AE;Pain OT Treatment Interventions: Self-care/ADL training;DME and/or AE instruction;Patient/family education;Balance training   OT Goals Acute Rehab OT Goals OT Goal Formulation: With patient Time For Goal Achievement: 10/31/11 Potential to Achieve Goals: Good ADL Goals Pt Will Perform Grooming: Independently;Standing at sink;Unsupported (2 tasks) ADL Goal: Grooming - Progress: Goal set today Pt Will Perform Tub/Shower Transfer: with supervision;Ambulation;with DME;Shower seat with back;Maintaining back safety precautions ADL Goal: Web designer - Progress: Goal set today Miscellaneous OT Goals Miscellaneous OT Goal #1: The patient will be independent with don/doff brace. OT Goal: Miscellaneous Goal #1 - Progress: Goal set today Miscellaneous OT Goal #2: Pt will be Mod I in/OOB for BADLs. OT Goal: Miscellaneous Goal #2 - Progress: Goal set today  Visit Information  Last OT Received On: 10/29/11 Assistance Needed: +1 PT/OT Co-Evaluation/Treatment: Yes (partial)    Subjective Data  Subjective: I will be staying at my boyfriends house for awhile Patient Stated Goal: Did not ask   Prior Functioning  Home Living Lives With:  (boyfriend) Available Help at Discharge: Friend(s);Available 24 hours/day Type of Home: House Home Access: Stairs to enter [employer] of Steps: 2 Entrance Stairs-Rails: Left Home Layout: One level Bathroom Shower/Tub: Forensic scientist: Standard (counter on left) Bathroom Accessibility: Yes How Accessible: Accessible via walker Home Adaptive Equipment: None Prior Function Level of Independence: Independent Able to Take Stairs?:  Yes Driving: Yes 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) Communication Communication: No difficulties Dominant Hand: Right    Cognition  Overall Cognitive Status: Appears within functional limits for tasks assessed/performed Arousal/Alertness: Awake/alert Orientation Level: Appears intact for tasks assessed Behavior  During Session: [facility] for tasks performed    Extremity/Trunk Assessment Right Upper Extremity Assessment RUE ROM/Strength/Tone: Within functional levels Left Upper Extremity Assessment LUE ROM/Strength/Tone: Within functional levels Right Lower Extremity Assessment RLE ROM/Strength/Tone: [facility] for tasks assessed Left Lower Extremity Assessment LLE ROM/Strength/Tone: WFL for tasks assessed   Mobility Bed Mobility Bed Mobility: Rolling Right;Right Sidelying to Sit Rolling Right: 5: Supervision Right Sidelying to Sit: 4: Min guard;HOB flat Transfers Transfers: Sit to Stand;Stand to Sit Sit to Stand: 4: Min assist;With upper extremity assist;From bed Stand to Sit: 4: Min guard;With upper extremity assist;To chair/3-in-1   Exercise    Balance Balance Balance Assessed: No  End of Session OT - End of Session Equipment Utilized During Treatment: Back brace;Gait belt Activity Tolerance: Patient tolerated treatment well Patient left: in chair;with call bell/phone within reach       Evette Georges [phone] 10/29/2011, 4:35 PM

## 2011-10-29 NOTE — Evaluation (Signed)
Physical Therapy Evaluation Patient Details Name: Sherry Willis MRN: [id number] DOB: [birth year] Today's Date: 10/29/2011 Time: 1478-2956 PT Time Calculation (min): 35 min  PT Assessment / Plan / Recommendation Clinical Impression  Pt did well for her first walk.  she needed cues to stand tall and look up.  she is used to looking down since her legs used to go numb when walking prior to surgery.  Pt denied any numbness today when walking.    PT Assessment  Patient needs continued PT services    Follow Up Recommendations  Home health PT    Barriers to Discharge        Equipment Recommendations  None recommended by PT    Recommendations for Other Services     Frequency Min 6X/week    Precautions / Restrictions Precautions Precautions: Back Required Braces or Orthoses: Spinal Brace Spinal Brace: Applied in sitting position   Pertinent Vitals/Pain NA      Mobility  Bed Mobility Bed Mobility: Rolling Right;Right Sidelying to Sit Rolling Right: 5: Supervision Right Sidelying to Sit: 4: Min guard;HOB flat Transfers Transfers: Sit to Stand Sit to Stand: 4: Min assist;With upper extremity assist;From bed Stand to Sit: 4: Min guard;With upper extremity assist;To chair/3-in-1 Ambulation/Gait Ambulation/Gait Assistance: 4: Min guard Ambulation Distance (Feet): 100 Feet Assistive device: Rolling walker Ambulation/Gait Assistance Details: Pt then walked 20 feet with no device with min assist.  Pt had to be cued to look up when walking - she says she has habit of looking down since her legs would go numb she would look at them when walking to decresae fall potential. Pt reprots she never fell    Exercises     PT Diagnosis: Difficulty walking;Generalized weakness;Acute pain  PT Problem List: Decreased activity tolerance;Decreased mobility;Decreased knowledge of use of DME;Decreased knowledge of precautions PT Treatment Interventions: DME instruction;Gait training;Stair  training;Functional mobility training;Therapeutic activities;Patient/family education   PT Goals Acute Rehab PT Goals PT Goal Formulation: With patient Time For Goal Achievement: 11/05/11 Potential to Achieve Goals: Good Pt will go Supine/Side to Sit: Independently PT Goal: Supine/Side to Sit - Progress: Goal set today Pt will go Sit to Stand: with supervision PT Goal: Sit to Stand - Progress: Goal set today Pt will Transfer Bed to Chair/Chair to Bed: Independently;with supervision PT Transfer Goal: Bed to Chair/Chair to Bed - Progress: Goal set today Pt will Ambulate: >150 feet;with supervision;with least restrictive assistive device PT Goal: Ambulate - Progress: Goal set today Pt will Go Up / Down Stairs: 3-5 stairs;with rail(s) PT Goal: Up/Down Stairs - Progress: Goal set today  Visit Information  Last PT Received On: 10/29/11 Assistance Needed: +1    Subjective Data  Subjective: Im ready to get up Patient Stated Goal: To get back home and not have legs go numb when walking.   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 Communication: No difficulties Dominant Hand: Right    Cognition  Overall Cognitive Status: Appears within functional limits for tasks assessed/performed Arousal/Alertness: Awake/alert Orientation Level: Appears intact for tasks assessed Behavior During Session:  WFL for  tasks performed    Extremity/Trunk Assessment Right Upper Extremity Assessment RUE ROM/Strength/Tone: Within functional levels Left Upper Extremity Assessment LUE ROM/Strength/Tone: Within functional levels Right Lower Extremity Assessment RLE ROM/Strength/Tone: [facility] for tasks assessed Left Lower Extremity Assessment LLE ROM/Strength/Tone: [facility] for tasks assessed   Balance Balance Balance Assessed: No  End of Session PT - End of Session Equipment Utilized During Treatment: Gait belt;Back brace Activity Tolerance: Patient tolerated treatment well;Patient limited by fatigue Patient left: in chair;with call bell/phone within reach  GP     Sherry Willis 10/29/2011, 4:03 PM

## 2011-10-30 MED FILL — Sodium Chloride IV Soln 0.9%: INTRAVENOUS | Qty: 1000 | Status: AC

## 2011-10-30 MED FILL — Heparin Sodium (Porcine) Inj 1000 Unit/ML: INTRAMUSCULAR | Qty: 30 | Status: AC

## 2011-10-30 MED FILL — Sodium Chloride Irrigation Soln 0.9%: Qty: 3000 | Status: AC

## 2011-10-30 NOTE — Progress Notes (Signed)
Patient discharged to home with significant other. All medications and follow up appointments clarified and discussed. All questions answered. Elmer Sow, RN

## 2011-10-30 NOTE — Discharge Summary (Signed)
Physician Discharge Summary  Patient ID: Sherry Willis MRN: [id number] DOB/AGE: 12/10/1955 56 y.o.  Admit date: 10/28/2011 Discharge date: 10/30/2011  Admission Diagnoses:la5 spondylolisthesis  Discharge Diagnoses: same   Discharged Condition: no pain  Hospital Course: surgery  Consults: no  Significant Diagnostic Studies: myelogram  Treatments: lumbar fusion  Discharge Exam: Blood pressure 100/63, pulse 73, temperature 98.2 F (36.8 C), temperature source Oral, resp. rate 18, height 5\' 6"  (1.676 m), weight 54.432 kg (120 lb), SpO2 98.00%. No weakness, no pain  Disposition: home   Medication List  As of 10/30/2011 10:16 AM   ASK your doctor about these medications         CALCIUM PO   Take 1 tablet by mouth daily.      gabapentin 100 MG capsule   Commonly known as: NEURONTIN   Take 200 mg by mouth 3 (three) times daily.      HYDROcodone-acetaminophen 5-500 MG per tablet   Commonly known as: VICODIN   Take 1 tablet by mouth every 12 (twelve) hours as needed. For pain      multivitamin with minerals Tabs   Take 1 tablet by mouth daily.      omega-3 acid ethyl esters 1 G capsule   Commonly known as: LOVAZA   Take 2 g by mouth daily.      POTASSIUM PO   Take 1 tablet by mouth daily. Over the counter supplement      vitamin A 8000 UNIT capsule   Take 8,000 Units by mouth daily.      VITAMIN D PO   Take 1 capsule by mouth daily.      ZINC PO   Take 1 tablet by mouth daily. Over the counter supplement             Signed: Karn Cassis 10/30/2011, 10:16 AM

## 2011-10-30 NOTE — Progress Notes (Signed)
Physical Therapy Treatment Patient Details Name: Sherry Willis MRN: [id number] DOB: 01-30-1956 Today's Date: 10/30/2011 Time: 1308-6578 PT Time Calculation (min): 18 min  PT Assessment / Plan / Recommendation Comments on Treatment Session  Pt ambulated well and seemed comfortable with stairs.  Pt eager and motivated to participate in therapy to regain function.  Continue per POC    Follow Up Recommendations  Home health PT    Barriers to Discharge        Equipment Recommendations  3 in 1 bedside comode    Recommendations for Other Services    Frequency Min 6X/week   Plan Discharge plan remains appropriate;Frequency remains appropriate    Precautions / Restrictions Precautions Precautions: Back Precaution Comments: Pt able to repeat 3/3 back precautions.   Required Braces or Orthoses: Spinal Brace Spinal Brace: Lumbar corset;Applied in sitting position Restrictions Weight Bearing Restrictions: No   Pertinent Vitals/Pain 3/10 back/surgical    Mobility  Bed Mobility Details for Bed Mobility Assistance: Pt was seated in chair at start of session Transfers Sit to Stand: 5: Supervision;From chair/3-in-1;With upper extremity assist;With armrests Stand to Sit: 5: Supervision;With upper extremity assist;To chair/3-in-1;With armrests Details for Transfer Assistance: Pt demonstrated proper form for transfers to/from chair.  Supervision for safety. Ambulation/Gait Ambulation/Gait Assistance: 4: Min guard Ambulation Distance (Feet): 200 Feet (100 ft RW / 100 ft no RW) Assistive device: Rolling walker;None Ambulation/Gait Assistance Details: Min guard assist for safety.  VC to look up when ambulating.   Gait Pattern: Step-through pattern;Decreased stride length;Shuffle Gait velocity: decreased gait speed General Gait Details: Pt was able to ambulate equally well with RW and without RW.   Stairs: Yes Stairs Assistance: 4: Min guard Stair Management Technique: One rail Right;Step  to pattern (pt also educated on sideways pattern) Number of Stairs: 5     Exercises     PT Diagnosis:    PT Problem List:   PT Treatment Interventions:     PT Goals Acute Rehab PT Goals PT Goal: Sit to Stand - Progress: Met PT Goal: Ambulate - Progress: Progressing toward goal PT Goal: Up/Down Stairs - Progress: Progressing toward goal  Visit Information  Last PT Received On: 10/30/11 Assistance Needed: +1    Subjective Data      Cognition  Overall Cognitive Status: Appears within functional limits for tasks assessed/performed Arousal/Alertness: Awake/alert Orientation Level: Appears intact for tasks assessed Behavior During Session: [facility] for tasks performed    Balance     End of Session PT - End of Session Equipment Utilized During Treatment: Gait belt;Back brace Activity Tolerance: Patient tolerated treatment well Patient left: in chair;with call bell/phone within reach Nurse Communication: Mobility status   GP     [name], SPTA 10/30/2011, 2:59 PM

## 2011-10-30 NOTE — Progress Notes (Signed)
Occupational Therapy Treatment Patient Details Name: Sherry Willis MRN: [id number] DOB: 14-Feb-1956 Today's Date: 10/30/2011 Time: 4098-1191 OT Time Calculation (min): 26 min  OT Assessment / Plan / Recommendation Comments on Treatment Session Pt progressing toward goals.  Reports she might be d/c'd home today.      Follow Up Recommendations  No OT follow up    Barriers to Discharge       Equipment Recommendations  3 in 1 bedside comode    Recommendations for Other Services    Frequency Min 2X/week   Plan Discharge plan remains appropriate    Precautions / Restrictions Precautions Precautions: Back Precaution Comments: Educated pt on adhering to back precautions during ADLs and functional mobility. Required Braces or Orthoses: Spinal Brace Spinal Brace: Applied in sitting position;Lumbar corset   Pertinent Vitals/Pain See vitals    ADL  Grooming: Performed;Wash/dry hands;Modified independent Where Assessed - Grooming: Unsupported standing Toilet Transfer: Research scientist (life sciences) Method: Sit to Barista: Raised toilet seat with arms (or 3-in-1 over toilet) Toileting - Clothing Manipulation and Hygiene: Performed;Modified independent Where Assessed - Toileting Clothing Manipulation and Hygiene: Standing Tub/Shower Transfer: Engineer, manufacturing Method: Ambulating Equipment Used: Back brace;Gait belt;Rolling walker Transfers/Ambulation Related to ADLs: supervision with RW, intermittent min guard assist during turns for safety ADL Comments: Educated pt on safe tub transfer by side stepping into tub, and pt able to return demonstration. Discussed with pt use of 3n1 as a shower chair.   Pt donned back brace with mod I sitting EOB.    OT Diagnosis:    OT Problem List:   OT Treatment Interventions:     OT Goals ADL Goals Pt Will Perform Grooming: Independently;Standing at sink;Unsupported ADL Goal:  Grooming - Progress: Progressing toward goals Pt Will Perform Tub/Shower Transfer: with supervision;Ambulation;with DME;Shower seat with back;Maintaining back safety precautions ADL Goal: Web designer - Progress: Met Miscellaneous OT Goals Miscellaneous OT Goal #1: The patient will be independent with don/doff brace. OT Goal: Miscellaneous Goal #1 - Progress: Met Miscellaneous OT Goal #2: Pt will be Mod I in/OOB for BADLs. OT Goal: Miscellaneous Goal #2 - Progress: Progressing toward goals  Visit Information  Last OT Received On: 10/30/11    Subjective Data      Prior Functioning       Cognition  Overall Cognitive Status: Appears within functional limits for tasks assessed/performed Arousal/Alertness: Awake/alert Orientation Level: Appears intact for tasks assessed Behavior During Session: [facility] for tasks performed    Mobility Bed Mobility Bed Mobility: Rolling Right;Right Sidelying to Sit;Sitting - Scoot to Edge of Bed Rolling Right: 5: Supervision Right Sidelying to Sit: 4: Min guard;HOB flat Sitting - Scoot to Edge of Bed: 6: Modified independent (Device/Increase time) Details for Bed Mobility Assistance: Verbal cues for correct log roll technique.  Min guard assist to trunk when transitioning up OOB. Transfers Transfers: Sit to Stand;Stand to Sit Sit to Stand: 4: Min assist;5: Supervision;From chair/3-in-1;From bed;With upper extremity assist Stand to Sit: 5: Supervision;To chair/3-in-1;With armrests;With upper extremity assist Details for Transfer Assistance: Min assist from low surface of bed.  Supervision from 3n1 over toilet.  During sit <>stand from bed, pt required verbal cueing for safe hand placement on bed rather than RW.  Pt able to independently return demonstration for safe hand placement with sit<>stand to/from 3n1 and chair.   Exercises    Balance    End of Session OT - End of Session Equipment Utilized During Treatment: Back brace;Gait belt Activity  Tolerance: Patient  tolerated treatment well Patient left: in chair;with call bell/phone within reach  10/30/2011 Cipriano Mile OTR/L Pager [phone] Office [phone]      Sherry Willis, Sherry Willis 10/30/2011, 10:53 AM

## 2011-10-31 NOTE — Progress Notes (Signed)
Seen and agreed 10/31/2011 Dalina Samara Elizabeth PTA [phone] pager [phone] office    

## 2013-03-24 IMAGING — RF DG LUMBAR SPINE 2-3V
1 series · 2 of 2 positions shown · non-contrast
Comparison: None.

CLINICAL DATA: Low back pain

DG C-ARM 1-60 MIN,LUMBAR SPINE - 2-3 VIEW

[Series 1: run · 2 of 2 slices shown]
[im 1/2]
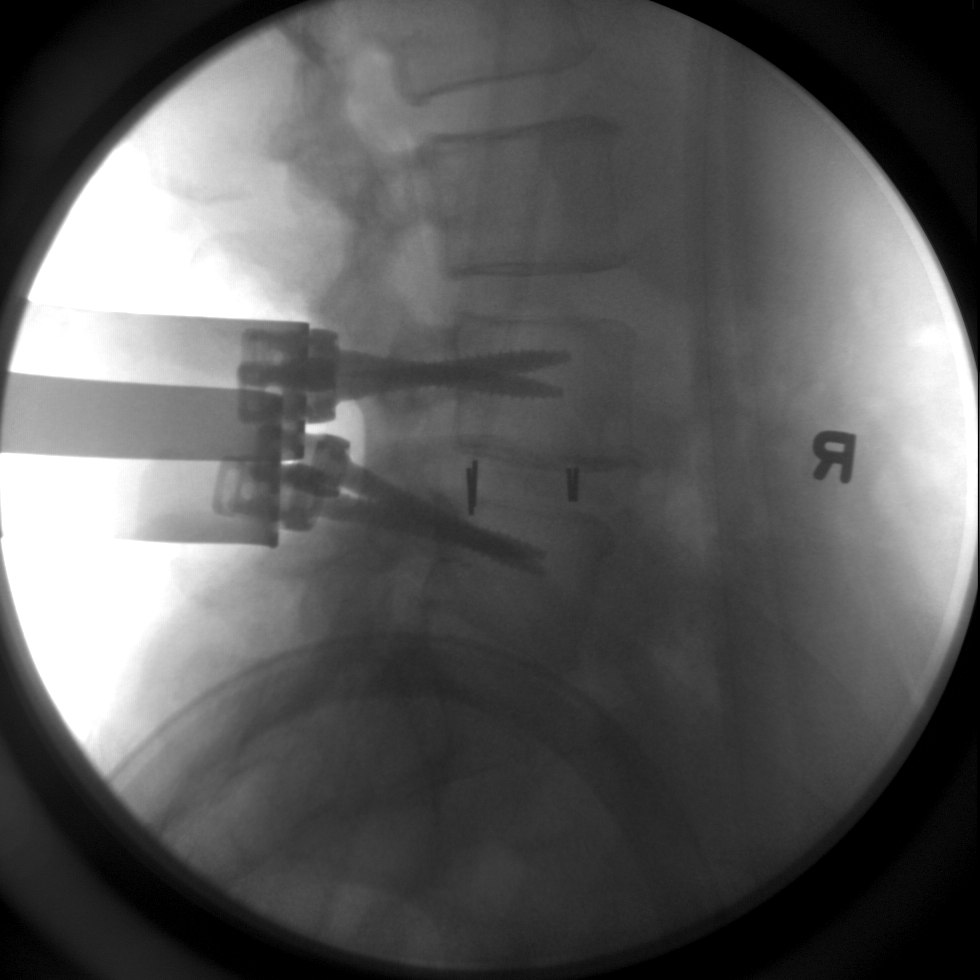
[im 2/2]
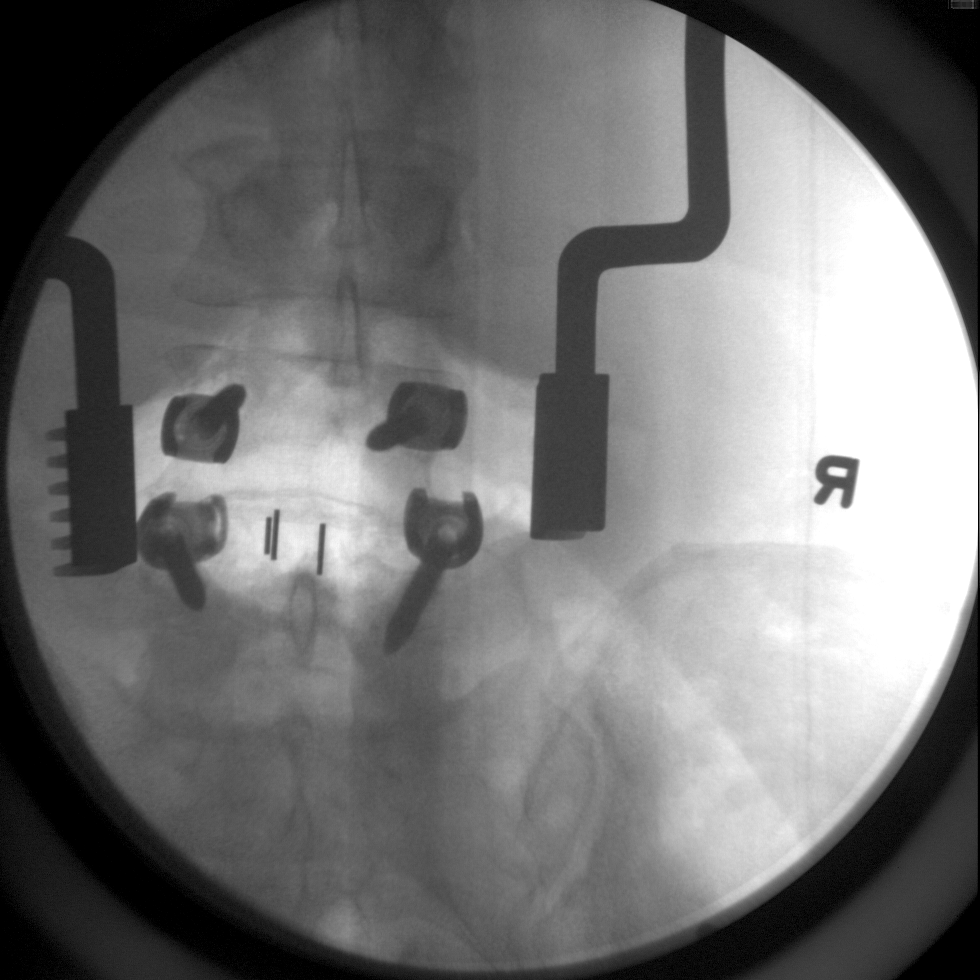

[2 of 2 positions shown; findings below may reference images not displayed]

FINDINGS: C-arm films document L4-5 PLIF.  Bilateral L4 and L5
pedicle screws.  Interbody spacer good position.  No adverse
features.
IMPRESSION: As above.

## 2018-03-31 ENCOUNTER — Other Ambulatory Visit: Payer: Self-pay

## 2018-03-31 NOTE — Patient Outreach (Signed)
[facility]) Care Management  03/31/2018  Andreas OhmBeth V Krehbiel 06-06-1955 [id number]   Telephone Screen  Referral Date: 03/31/18 Referral Source[facility] Referral Reason: " 03/30/18 6:11pm-caller states she started having a lot of pressure on left lower side, possibly having some vaginal bleeding and blood in urine, temp 99, BP 167/74, feels like she has to urinate even after finished" Insurance: unknown   Outreach attempt # 1 to patient. No answer at present. RN CM left HIPAA compliant voicemail message along with contact info.      Plan: RN CM will make outreach attempt to patient within 3-4 business days. RN CM unable to send letter as patient non-[facility] and not eligible for services.   Antionette Fairyoshanda Wilberth Damon, RN,BSN,CCM [facility] Telephonic Care Management Coordinator Direct Phone: [phone] Toll Free: [phone] Fax: [phone]

## 2018-04-02 ENCOUNTER — Other Ambulatory Visit: Payer: Self-pay

## 2018-04-02 NOTE — Patient Outreach (Signed)
[facility]) Care Management  04/02/2018  Sherry OhmBeth V Willis 1955-08-01 [id number]   Telephone Screen  Referral Date: 03/31/18 Ref[facility] Referral Reason: " 03/30/18 6:11pm-caller states she started having a lot of pressure on left lower side, possibly having some vaginal bleeding and blood in urine, temp 99, BP 167/74, feels like she has to urinate even after finished" Insurance: unknown    Outreach attempt # 2 to patient. No answer at present.    Plan: RN CM will make outreach attempt to patient within 3-4 business days.   Antionette Fairyoshanda Pernell Dikes, RN,BSN,CCM [facility] Care [facility] Telephonic Care Management Coordinator Direct Phone: [phone] Toll Free: [phone] Fax: [phone]

## 2018-04-05 ENCOUNTER — Other Ambulatory Visit: Payer: Self-pay

## 2018-04-05 NOTE — Patient Outreach (Signed)
[facility]) Care Management  04/05/2018  Sherry Willis [birth year] [id number]   Telephone Screen  Referral Date:03/31/18 Referral Source:[facility] Referral Reason:" 03/30/18 6:11pm-caller states she started having a lot of pressure on left lower side, possibly having some vaginal bleeding and blood in urine, temp 99, BP 167/74, feels like she has to urinate even after finished" Insurance:unknown    Outreach attempt #3 to patient. No answer at present.    Plan: RN CM will close case at this time.   Antionette Fairyoshanda Yuriana Gaal, RN,BSN,CCM [facility] Care [facility] Telephonic Care Management Coordinator Direct Phone: [phone] Toll Free: [phone] Fax: [phone]
